# Patient Record
Sex: Male | Born: 1980 | Race: Black or African American | Hispanic: No | Marital: Married | Smoking: Former smoker
Health system: Southern US, Community
[De-identification: ages and names within clinical notes are randomized; demographics above are authoritative.]

## PROBLEM LIST (undated history)

## (undated) DIAGNOSIS — R001 Bradycardia, unspecified: Secondary | ICD-10-CM

## (undated) HISTORY — PX: KNEE ARTHROSCOPY: SUR90

## (undated) HISTORY — PX: OTHER SURGICAL HISTORY: SHX169

## (undated) HISTORY — DX: Bradycardia, unspecified: R00.1

## (undated) HISTORY — PX: KNEE SURGERY: SHX244

---

## 2004-08-22 ENCOUNTER — Emergency Department (HOSPITAL_COMMUNITY): Admission: EM | Admit: 2004-08-22 | Discharge: 2004-08-22 | Payer: Self-pay | Admitting: Emergency Medicine

## 2010-09-30 ENCOUNTER — Emergency Department (HOSPITAL_COMMUNITY)
Admission: EM | Admit: 2010-09-30 | Discharge: 2010-09-30 | Disposition: A | Discharge status: Home / Self Care | Payer: Self-pay | Attending: Emergency Medicine | Admitting: Emergency Medicine

## 2010-09-30 ENCOUNTER — Emergency Department (HOSPITAL_COMMUNITY): Payer: Self-pay

## 2010-09-30 DIAGNOSIS — M7989 Other specified soft tissue disorders: Secondary | ICD-10-CM | POA: Insufficient documentation

## 2010-09-30 DIAGNOSIS — M25569 Pain in unspecified knee: Secondary | ICD-10-CM | POA: Insufficient documentation

## 2012-07-26 ENCOUNTER — Emergency Department (HOSPITAL_COMMUNITY)
Admission: EM | Admit: 2012-07-26 | Discharge: 2012-07-26 | Disposition: A | Discharge status: Home / Self Care | Payer: Self-pay | Attending: Emergency Medicine | Admitting: Emergency Medicine

## 2012-07-26 ENCOUNTER — Encounter (HOSPITAL_COMMUNITY): Payer: Self-pay

## 2012-07-26 ENCOUNTER — Emergency Department (HOSPITAL_COMMUNITY): Payer: Self-pay

## 2012-07-26 DIAGNOSIS — Y9371 Activity, boxing: Secondary | ICD-10-CM | POA: Insufficient documentation

## 2012-07-26 DIAGNOSIS — M7710 Lateral epicondylitis, unspecified elbow: Secondary | ICD-10-CM | POA: Insufficient documentation

## 2012-07-26 DIAGNOSIS — Z87891 Personal history of nicotine dependence: Secondary | ICD-10-CM | POA: Insufficient documentation

## 2012-07-26 DIAGNOSIS — X500XXA Overexertion from strenuous movement or load, initial encounter: Secondary | ICD-10-CM | POA: Insufficient documentation

## 2012-07-26 DIAGNOSIS — M7712 Lateral epicondylitis, left elbow: Secondary | ICD-10-CM

## 2012-07-26 DIAGNOSIS — Y929 Unspecified place or not applicable: Secondary | ICD-10-CM | POA: Insufficient documentation

## 2012-07-26 MED ORDER — NAPROXEN 500 MG PO TABS: 500.0000 mg | ORAL_TABLET | Freq: Two times a day (BID) | ORAL | Status: DC

## 2012-07-26 NOTE — ED Notes (Signed)
Patient paraticipates in boxing and thinks he injured his left elbow approx a month ago. Patient states he has been icing and using Advil x 2 weeks with no relief. Patient attempted light boxing yesterday and had increased pain.  Patient able to wiggle fingers without difficulty.

## 2012-07-26 NOTE — ED Provider Notes (Signed)
History     CSN: [id number]  Arrival date & time 07/26/12  1013   First MD Initiated Contact with Patient 07/26/12 1024      Chief Complaint  Patient presents with  . Elbow Pain  . Elbow Injury    (Consider location/radiation/quality/duration/timing/severity/associated sxs/prior treatment) HPI Brad Coleman is a 32 y.o. male who presents to ED with complaint of a left elbow pain. 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 States felt better. Two days ago was able to box again. States felt well. States next morning (yesterday) woke up with stiffness and pain in left elbow again. States unable to straighter elbow all the way. Pain with movement. States thinks it is "dislocated, or maybe I have fluid on my joint that needs to be drained."   History reviewed. No pertinent past medical history.  Past Surgical History  Procedure Laterality Date  . Knee arthroscopy    . Arm fracture      Family History  Problem Relation Age of Onset  . Osteoarthritis Mother     History  Substance Use Topics  . Smoking status: Former Games developer  . Smokeless tobacco: Never Used  . Alcohol Use: No      Review of Systems  Constitutional: Negative for fever and chills.  Respiratory: Negative.   Cardiovascular: Negative.   Musculoskeletal: Positive for joint swelling and arthralgias.  Skin: Negative.   Neurological: Negative for weakness and numbness.    Allergies  Review of patient's allergies indicates no known allergies.  Home Medications   Current Outpatient Rx  Name  Route  Sig  Dispense  Refill  . ibuprofen (ADVIL,MOTRIN) 200 MG tablet   Oral   Take 800 mg by mouth every 6 (six) hours as needed for pain.           BP 141/63  Pulse 59  Temp(Src) 97.2 F (36.2 C) (Oral)  Resp 20  SpO2 99%  Physical Exam  Nursing note and vitals reviewed. Constitutional: He appears well-developed  and well-nourished. No distress.  Cardiovascular: Normal rate, regular rhythm and normal heart sounds.   Pulmonary/Chest: Effort normal and breath sounds normal. No respiratory distress. He has no wheezes. He has no rales.  Musculoskeletal:  Left elbow normal appearing. No swelling, bruising. Erythema. Tender over lateral epicondyle. Pain with flexion, pain with full extension. Pain with suppination and extension of the wrist. Normal distal radial pulses    ED Course  Procedures (including critical care time)  No results found for this or any previous visit. Dg Elbow Complete Left  07/26/2012  *RADIOLOGY REPORT*  Clinical Data: Left elbow pain.  LEFT ELBOW - COMPLETE 3+ VIEW  Comparison: None.  Findings: Rounded density near the medial epicondyle is most likely an unfused apophysis or old avulsion injury.  Minimal degenerative changes are noted.  No definite acute fracture but there is a small joint effusion.  IMPRESSION:  1.  Mild degenerative changes but no definite acute fracture. 2.  Probable unfused medial epicondyle apophysis or old avulsion injury. 3.  Small joint effusion.   Original Report Authenticated By: Rudie Meyer, M.D.       1. Lateral epicondylitis of elbow, left       MDM  Pt has no tenderness over medial epicondyle, but rather lateral. Suspect epicondylitis vs sprain. Sling given. Will follow up with hand specialist. NSAIds and rest at home. Neurovascularly  intact.         Lottie Mussel, PA 07/26/12 1535

## 2012-07-28 NOTE — ED Provider Notes (Signed)
Medical screening examination/treatment/procedure(s) were performed by non-physician practitioner and as supervising physician I was immediately available for consultation/collaboration.  Altin Sease J. Renzo Vincelette, MD 07/28/12 1323 

## 2016-03-15 ENCOUNTER — Ambulatory Visit: Payer: Self-pay | Admitting: Physician Assistant

## 2016-03-15 ENCOUNTER — Encounter: Payer: Self-pay | Admitting: Physician Assistant

## 2016-03-15 NOTE — Progress Notes (Signed)
Cardiology Office Note    Date:  03/16/2016   ID:  Tinnie Gens, DOB 10/25/1980, MRN [id number]  PCP:  No primary care provider on file.  Cardiologist:  New-abnormal ECG.  CC: abnormal ECG  History of Present Illness:  Brad Coleman is a 35 y.o. male with no past medical history who presents to clinic for evaluation of an abnormal ECG.  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 A ECG was done at his PCP in [facility] and read as abnormal with IRBBB, TWIs and old anterior infarct. P waves in AVR are upright and down in all other leads.   He presents today for cardiac evaluation. Subsequent ECGs and ours done today shows sinus bradycardia. No chest pain or SOB. No LE edema, orthopnea or PND. No dizziness or syncope. No blood in his stool or urine. He trains at the gym 5 times a week. He runs 5 miles 5x a week. No decreased exercise tolerance. Never passed out with exercise. No family history of sudden cardiac death.     Past Medical History:  Diagnosis Date  . Bradycardia     Past Surgical History:  Procedure Laterality Date  . KNEE SURGERY Right     Current Medications: No outpatient prescriptions prior to visit.   No facility-administered medications prior to visit.      Allergies:   Review of patient's allergies indicates no known allergies.   Social History   Social History  . Marital status: Married    Spouse name: N/A  . Number of children: 0  . Years of education: COLLEGE   Occupational History  . [employer]    Social History Main Topics  . Smoking status: Former Smoker    Quit date: 05/30/2010  . Smokeless tobacco: Never Used  . Alcohol use Yes  . Drug use: No  . Sexual activity: Not Asked   Other Topics Concern  . None   Social History Narrative  . None     Family History:  The patient's HTN in mom.   ROS:   Please see the history of present  illness.    ROS All other systems reviewed and are negative.   PHYSICAL EXAM:   VS:  BP 140/70   Pulse (!) 58   Ht 6\' 3"  (1.905 m)   Wt 285 lb (129.3 kg)   BMI 35.62 kg/m    GEN: Well nourished, well developed, in no acute distress  HEENT: normal  Neck: no JVD, carotid bruits, or masses Cardiac: *RRR; no murmurs, rubs, or gallops,no edema  Respiratory:  clear to auscultation bilaterally, normal work of breathing GI: soft, nontender, nondistended, + BS MS: no deformity or atrophy  Skin: warm and dry, no rash Neuro:  Alert and Oriented x 3, Strength and sensation are intact Psych: euthymic mood, full affect  Wt Readings from Last 3 Encounters:  03/16/16 285 lb (129.3 kg)      Studies/Labs Reviewed:   EKG:  EKG is ordered today.  The ekg ordered today demonstrates sinus brady with sinus arrythmia HR 58  Recent Labs: No results found for requested labs within last 8760 hours.   Lipid Panel No results found for: CHOL, TRIG, HDL, CHOLHDL, VLDL, LDLCALC, LDLDIRECT  Additional studies/ records that were reviewed today include:  none   ASSESSMENT & PLAN:   Abnormal ECG: first ECG was read  as abnormal but there was incorrect lead placement. Subsequent ECGs show sinus bradycardia which is normal in a conditioned athlete. No further work up is indicated. He is cleared to box from a cardiac standpoint. A letter has been written for cardiac clearance.     Medication Adjustments/Labs and Tests Ordered: Current medicines are reviewed at length with the patient today.  Concerns regarding medicines are outlined above.  Medication changes, Labs and Tests ordered today are listed in the Patient Instructions below. Patient Instructions  Medication Instructions:  Your physician recommends that you continue on your current medications as directed. Please refer to the Current Medication list given to you today.   Labwork: None ordered  Testing/Procedures: None  ordered  Follow-Up: Your physician recommends that you schedule a follow-up appointment in: AS NEEDED     Any Other Special Instructions Will Be Listed Below (If Applicable).  Good luck on your boxing match!      If you need a refill on your cardiac medications before your next appointment, please call your pharmacy.      Signed, Cline CrockKathryn Mariesa Grieder, PA-C  03/16/2016 2:08 PM    [facility] [address], [place], [place]  [address] Phone: [phone](336) [phone]; Fax: [phone]) [phone]

## 2016-03-15 NOTE — Progress Notes (Signed)
   Cardiology Office Note    Date:  03/15/2016   ID:  Brad Gensonald L Ney, DOB 08-03-1980, MRN [id number]  PCP:  No primary care provider on file.  Cardiologist: new  No chief complaint on file.   History of Present Illness:  Brad Coleman is a 35 y.o. male referred to us by [facility] because of a family history of sudden cardiac arrest Patient cancelled appointment today.   No past medical history on file.  No past surgical history on file.  Current Medications: No outpatient prescriptions prior to visit.   No facility-administered medications prior to visit.      Allergies:   Review of patient's allergies indicates not on file.   Social History   Social History  . Marital status: Unknown    Spouse name: N/A  . Number of children: N/A  . Years of education: N/A   Social History Main Topics  . Smoking status: Not on file  . Smokeless tobacco: Not on file  . Alcohol use Not on file  . Drug use: Unknown  . Sexual activity: Not on file   Other Topics Concern  . Not on file   Social History Narrative  . No narrative on file     Family History:  The patient's family history is not on file.   ROS:   Please see the history of present illness.    ROS All other systems reviewed and are negative.   PHYSICAL EXAM:   VS:  There were no vitals taken for this visit.  Physical Exam  GEN: Well nourished, well developed, in no acute distress  HEENT: normal  Neck: no JVD, carotid bruits, or masses Cardiac:RRR; no murmurs, rubs, or gallops  Respiratory:  clear to auscultation bilaterally, normal work of breathing GI: soft, nontender, nondistended, + BS Ext: without cyanosis, clubbing, or edema, Good distal pulses bilaterally MS: no deformity or atrophy  Skin: warm and dry, no rash Neuro:  Alert and Oriented x 3, Strength and sensation are intact Psych: euthymic mood, full affect  Wt Readings from Last 3  Encounters:  No data found for Wt      Studies/Labs Reviewed:   EKG:  EKG is ordered today.  The ekg ordered today demonstrates   Recent Labs: No results found for requested labs within last 8760 hours.   Lipid Panel No results found for: CHOL, TRIG, HDL, CHOLHDL, VLDL, LDLCALC, LDLDIRECT  Additional studies/ records that were reviewed today include:      ASSESSMENT:    No diagnosis found.   PLAN:  In order of problems listed above:      Medication Adjustments/Labs and Tests Ordered: Current medicines are reviewed at length with the patient today.  Concerns regarding medicines are outlined above.  Medication changes, Labs and Tests ordered today are listed in the Patient Instructions below. There are no Patient Instructions on file for this visit.   Signed, Jacolyn ReedyMichele Oluwafemi Villella, PA-C  03/15/2016 10:50 AM    [facility] [address], [place], [place]  [address] Phone: [phone]([phone]; Fax: [phone]([phone]

## 2016-03-16 ENCOUNTER — Ambulatory Visit (INDEPENDENT_AMBULATORY_CARE_PROVIDER_SITE_OTHER): Payer: Self-pay | Admitting: Physician Assistant

## 2016-03-16 ENCOUNTER — Encounter: Payer: Self-pay | Admitting: *Deleted

## 2016-03-16 ENCOUNTER — Encounter (INDEPENDENT_AMBULATORY_CARE_PROVIDER_SITE_OTHER): Payer: Self-pay

## 2016-03-16 ENCOUNTER — Encounter: Payer: Self-pay | Admitting: Physician Assistant

## 2016-03-16 VITALS — BP 140/70 | HR 58 | Ht 75.0 in | Wt 285.0 lb

## 2016-03-16 DIAGNOSIS — R9431 Abnormal electrocardiogram [ECG] [EKG]: Secondary | ICD-10-CM

## 2016-03-16 DIAGNOSIS — R001 Bradycardia, unspecified: Secondary | ICD-10-CM

## 2016-03-16 NOTE — Patient Instructions (Signed)
Medication Instructions:  Your physician recommends that you continue on your current medications as directed. Please refer to the Current Medication list given to you today.   Labwork: None ordered  Testing/Procedures: None ordered  Follow-Up: Your physician recommends that you schedule a follow-up appointment in: AS NEEDED     Any Other Special Instructions Will Be Listed Below (If Applicable).  Good luck on your boxing match!      If you need a refill on your cardiac medications before your next appointment, please call your pharmacy.

## 2016-03-17 ENCOUNTER — Telehealth: Payer: Self-pay | Admitting: Physician Assistant

## 2016-03-17 NOTE — Telephone Encounter (Signed)
Eileen StanfordJenna from [facility] calling requesting copy of office notes from yesterday.  They had referred him for abnormal EKG.  Faxed Aris GeorgiaKatie Thompson,PA's note to [phone].

## 2016-03-17 NOTE — Telephone Encounter (Signed)
Eileen StanfordJenna is calling get get office notes from yesterdays visit faxed to her. Fax:[phone]

## 2016-03-22 ENCOUNTER — Telehealth: Payer: Self-pay | Admitting: Physician Assistant

## 2016-03-22 NOTE — Telephone Encounter (Signed)
Will route to medical records to provide patient with his EKG.

## 2016-03-22 NOTE — Telephone Encounter (Signed)
Spoke with Patient-he will come by sign release and pick up copy of EKG.

## 2016-03-22 NOTE — Telephone Encounter (Signed)
Follow Up:    Pt saw Brad Coleman last week,he says he needs the actual reading of his EKG.Please call.

## 2016-05-14 ENCOUNTER — Encounter (HOSPITAL_COMMUNITY): Payer: Self-pay

## 2016-09-12 ENCOUNTER — Ambulatory Visit: Payer: BLUE CROSS/BLUE SHIELD | Attending: Family Medicine | Admitting: Physical Therapy

## 2016-09-12 ENCOUNTER — Encounter: Payer: Self-pay | Admitting: Physical Therapy

## 2016-09-12 DIAGNOSIS — M545 Low back pain, unspecified: Secondary | ICD-10-CM

## 2016-09-12 DIAGNOSIS — M62830 Muscle spasm of back: Secondary | ICD-10-CM | POA: Diagnosis present

## 2016-09-12 NOTE — Therapy (Signed)
[facility] [place] [facility] [address] [place], [place], [address] Phone: [phone]   Fax:  [phone]  Physical Therapy Evaluation  Patient Details  Name: Brad Coleman MRN: [id number] Date of Birth: [birth year] Referring Provider: Virl Son  Encounter Date: 09/12/2016      PT End of Session - 09/12/16 1348    Visit Number 1   Date for PT Re-Evaluation 11/12/16   PT Start Time 1316   PT Stop Time 1403   PT Time Calculation (min) 47 min   Activity Tolerance Patient tolerated treatment well   Behavior During Therapy [facility] for tasks assessed/performed      Past Medical History:  Diagnosis Date  . Bradycardia     Past Surgical History:  Procedure Laterality Date  . arm fracture    . KNEE ARTHROSCOPY    . KNEE SURGERY Right     There were no vitals filed for this visit.       Subjective Assessment - 09/12/16 1316    Subjective Patient reports that he has had long standing LBP.  Has a diagnosis of DDD.  Reports 5-8 years of some LBP.  Reports that he had some back pain that resolved but the past year he has had much more pain.  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   Patient Stated Goals have no pain   Currently in Pain? Yes   Pain Score 1    Pain Location Back   Pain Orientation Lower   Pain Descriptors / Indicators Tightness;Spasm;Sore   Pain Type Chronic pain   Pain Onset More than a month ago   Pain Frequency Intermittent   Aggravating Factors  first thing inthe AM is the worst, pain an 8/10   Pain Relieving Factors getting up and moving his pain will dissipate to 1-2/10   Effect of Pain on Daily Activities just sore.            [facility] PT Assessment - 09/12/16 0001      Assessment   Medical Diagnosis LBP   Referring Provider Tammy Boyd   Onset Date/Surgical Date 09/05/16   Prior Therapy no     Precautions   Precautions None     Balance Screen   Has the patient fallen in the past 6  months No   Has the patient had a decrease in activity level because of a fear of falling?  No   Is the patient reluctant to leave their home because of a fear of falling?  No     Home Environment   Additional Comments does some housework     Prior Function   Level of Independence Independent   Vocation Part time employment   Vocation Requirements lifting 5-100 # at [employer] 4-5 hours a day   Leisure Boxing, runs, starting to [employer] again     Posture/Postural Control   Posture Comments fwd head, rounded shoulders     ROM / Strength   AROM / PROM / Strength AROM;Strength     AROM   Overall AROM Comments Lumbar ROM was decreased 25% with some c/o tightness     Strength   Overall Strength Comments WNL's, mild increase of pain     Palpation   Palpation comment he is very tight in the lumbar  area, spasms are present , some tenderness in this area.  [facility] Adult PT Treatment/Exercise - 09/12/16 0001      Modalities   Modalities Electrical Stimulation;Moist Heat     Moist Heat Therapy   Number Minutes Moist Heat 15 Minutes   Moist Heat Location Lumbar Spine     Electrical Stimulation   Electrical Stimulation Location Lumbar spine   Electrical Stimulation Action IFC   Electrical Stimulation Parameters supine   Electrical Stimulation Goals Pain                PT Education - 09/12/16 1347    Education provided Yes   Education Details Wms Flexion   Person(s) Educated Patient   Methods Explanation;Demonstration;Handout   Comprehension Verbalized understanding          PT Short Term Goals - 09/12/16 1350      PT SHORT TERM GOAL #1   Title independent with HEP   Time 2   Period Weeks   Status New           PT Long Term Goals - 09/12/16 1350      PT LONG TERM GOAL #1   Title decrease pain 50% in the AM   Time 8   Period Weeks   Status New     PT LONG TERM GOAL #2   Title increase ROM to WNL's   Time 8   Period  Weeks   Status New     PT LONG TERM GOAL #3   Title understand proper posture and body mechanics   Time 8   Period Weeks   Status New     PT LONG TERM GOAL #4   Title return to weight lifting without difficulty   Time 8   Period Weeks   Status New               Plan - 09/12/16 1348    Clinical Impression Statement Patient reports that he had years of low back pain, had some resolution of pain but increased LBP over the past year.  X-rays show DDD.  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  He has good flexibility but some tihgtness, spasms are in the lumbar area, has most pain in the AM   Rehab Potential Good   PT Frequency 2x / week   PT Duration 6 weeks      Patient will benefit from skilled therapeutic intervention in order to improve the following deficits and impairments:  Decreased mobility, Improper body mechanics, Pain, Increased muscle spasms, Decreased range of motion  Visit Diagnosis: Acute midline low back pain without sciatica - Plan: PT plan of care cert/re-cert  Muscle spasm of back - Plan: PT plan of care cert/re-cert     Problem List There are no active problems to display for this patient.   Jearld Lesch., PT 09/12/2016, 1:52 PM  [facility] [address] [place], [place], [address] Phone: [phone]   Fax:  [phone]  Name: Brad Coleman MRN: [id number] Date of Birth: 1981/01/09

## 2016-09-14 ENCOUNTER — Encounter: Payer: Self-pay | Admitting: Physical Therapy

## 2016-09-14 ENCOUNTER — Ambulatory Visit: Payer: BLUE CROSS/BLUE SHIELD | Admitting: Physical Therapy

## 2016-09-14 DIAGNOSIS — M62830 Muscle spasm of back: Secondary | ICD-10-CM

## 2016-09-14 DIAGNOSIS — M545 Low back pain, unspecified: Secondary | ICD-10-CM

## 2016-09-14 NOTE — Therapy (Signed)
[facility] [place] [address] [place], [place], [address] Phone: [phone]   Fax:  [phone]  Physical Therapy Treatment  Patient Details  Name: Brad Coleman MRN: [id number] Date of Birth: 25-Jun-1980 Referring Provider: Virl Son  Encounter Date: 09/14/2016      PT End of Session - 09/14/16 1516    Visit Number 2   Date for PT Re-Evaluation 11/12/16   PT Start Time 1430   PT Stop Time 1527   PT Time Calculation (min) 57 min   Activity Tolerance Patient tolerated treatment well   Behavior During Therapy [facility] for tasks assessed/performed      Past Medical History:  Diagnosis Date  . Bradycardia     Past Surgical History:  Procedure Laterality Date  . arm fracture    . KNEE ARTHROSCOPY    . KNEE SURGERY Right     There were no vitals filed for this visit.      Subjective Assessment - 09/14/16 1434    Subjective Pt reports no changes evaluation. Pt reports that the heat and e-Stim really helped   Currently in Pain? No/denies   Pain Score 0-No pain                         [facility] Adult PT Treatment/Exercise - 09/14/16 0001      Exercises   Exercises Lumbar     Lumbar Exercises: Standing   Other Standing Lumbar Exercises Pall of press 35 x10, 45x10    Other Standing Lumbar Exercises straight arm pull downs 35lb 2x15      Lumbar Exercises: Seated   Other Seated Lumbar Exercises Rows and lats 30flb 2x10   Other Seated Lumbar Exercises seated OHP LE elevated 10lb 2x10     Lumbar Exercises: Supine   Other Supine Lumbar Exercises russian twist seated on mat table LE elevated 2x10 each    Other Supine Lumbar Exercises #pt row 10 lb 2x15      Modalities   Modalities Electrical Stimulation;Moist Heat     Moist Heat Therapy   Number Minutes Moist Heat 15 Minutes   Moist Heat Location Lumbar Spine     Electrical Stimulation   Electrical Stimulation Location Lumbar spine   Electrical Stimulation Action IFC   Electrical Stimulation Parameters supine   Electrical Stimulation Goals Pain                  PT Short Term Goals - 09/12/16 1350      PT SHORT TERM GOAL #1   Title independent with HEP   Time 2   Period Weeks   Status New           PT Long Term Goals - 09/12/16 1350      PT LONG TERM GOAL #1   Title decrease pain 50% in the AM   Time 8   Period Weeks   Status New     PT LONG TERM GOAL #2   Title increase ROM to WNL's   Time 8   Period Weeks   Status New     PT LONG TERM GOAL #3   Title understand proper posture and body mechanics   Time 8   Period Weeks   Status New     PT LONG TERM GOAL #4   Title return to weight lifting without difficulty   Time 8   Period Weeks   Status New  Plan - 09/14/16 1516    Clinical Impression Statement Positive response  from heat and E-Stim after last treatment. Pt tolerated an initial progression to exercises well. Pt reports no increase in pain with today's exercises.    PT Frequency 2x / week   PT Duration 6 weeks   PT Next Visit Plan Progress with core strengthening exercises       Patient will benefit from skilled therapeutic intervention in order to improve the following deficits and impairments:  Decreased mobility, Improper body mechanics, Pain, Increased muscle spasms, Decreased range of motion  Visit Diagnosis: Acute midline low back pain without sciatica  Muscle spasm of back     Problem List There are no active problems to display for this patient.   Grayce Sessions, PTA 09/14/2016, 3:20 PM  [facility] [place] [facility] [address] [place], [place], [address] Phone: [phone]   Fax:  [phone]  Name: Brad Coleman MRN: [id number] Date of Birth: July 18, 1980

## 2016-09-20 ENCOUNTER — Ambulatory Visit: Payer: BLUE CROSS/BLUE SHIELD | Admitting: Physical Therapy

## 2016-09-20 ENCOUNTER — Encounter: Payer: Self-pay | Admitting: Physical Therapy

## 2016-09-20 DIAGNOSIS — M545 Low back pain, unspecified: Secondary | ICD-10-CM

## 2016-09-20 DIAGNOSIS — M62830 Muscle spasm of back: Secondary | ICD-10-CM

## 2016-09-20 NOTE — Therapy (Addendum)
[place] [address], [place], [address] Phone: [phone]   Fax:  [phone]  Physical Therapy Treatment  Patient Details  Name: Brad Coleman MRN: [id number] Date of Birth: 12-02-1980 Referring Provider: Precious Haws  Encounter Date: 09/20/2016      PT End of Session - 09/20/16 1058    Visit Number 3   Date for PT Re-Evaluation 11/12/16   PT Start Time 6962   PT Stop Time 1111   PT Time Calculation (min) 56 min   Activity Tolerance Patient tolerated treatment well   Behavior During Therapy [facility] for tasks assessed/performed      Past Medical History:  Diagnosis Date  . Bradycardia     Past Surgical History:  Procedure Laterality Date  . arm fracture    . KNEE ARTHROSCOPY    . KNEE SURGERY Right     There were no vitals filed for this visit.      Subjective Assessment - 09/20/16 1019    Subjective "Pt reports that he is feeling pretty good"   Currently in Pain? Yes   Pain Score 2    Pain Location Back                         [facility] Adult PT Treatment/Exercise - 09/20/16 0001      Lumbar Exercises: Aerobic   Elliptical NuStep L4 x 6 min     Lumbar Exercises: Machines for Strengthening   Leg Press 50lb 2x15       Lumbar Exercises: Standing   Other Standing Lumbar Exercises Pall of press  45lb 2x10      Lumbar Exercises: Seated   Other Seated Lumbar Exercises Rows and lats 45lb 2x10   Other Seated Lumbar Exercises seated OHP LE elevated 10lb 2x10     Lumbar Exercises: Supine   Other Supine Lumbar Exercises 3pt row 10 lb 2x15      Lumbar Exercises: Prone   Other Prone Lumbar Exercises Planks 3 x 40sec     Modalities   Modalities Cryotherapy;Electrical Stimulation     Cryotherapy   Number Minutes Cryotherapy 15 Minutes   Cryotherapy Location Lumbar Spine   Type of Cryotherapy Ice pack     Electrical Stimulation   Electrical Stimulation Location  Lumbar spine   Electrical Stimulation Action IFC   Electrical Stimulation Parameters supine   Electrical Stimulation Goals Pain                  PT Short Term Goals - 09/20/16 1058      PT SHORT TERM GOAL #1   Title independent with HEP   Status Achieved           PT Long Term Goals - 09/20/16 1058      PT LONG TERM GOAL #1   Title decrease pain 50% in the AM   Status Partially Met     PT LONG TERM GOAL #2   Title increase ROM to WNL's   Status On-going     PT LONG TERM GOAL #3   Title understand proper posture and body mechanics   Status On-going     PT LONG TERM GOAL #4   Title return to weight lifting without difficulty   Status On-going               Plan - 09/20/16 1058    Clinical Impression Statement STG met, tolerated treatment well,  no reports of increase pain. Pt does reports that the prone planks are tough. Some difficulty controlling posture with 3 point rows.   Rehab Potential Good   PT Frequency 2x / week   PT Duration 6 weeks   PT Next Visit Plan Progress with core strengthening exercises       Patient will benefit from skilled therapeutic intervention in order to improve the following deficits and impairments:  Decreased mobility, Improper body mechanics, Pain, Increased muscle spasms, Decreased range of motion  Visit Diagnosis: Acute midline low back pain without sciatica  Muscle spasm of back     Problem List There are no active problems to display for this patient.  PHYSICAL THERAPY DISCHARGE SUMMARY  Visits from Start of Care: 3  Plan: Patient agrees to discharge.  Patient goals were not met. Patient is being discharged due to not returning since the last visit.  ?????      Scot Jun, PTA 09/20/2016, 11:01 AM  [address] [place], [place], [address] Phone: [phone]   Fax:  [phone]  Name: Brad Coleman MRN:  [id number] Date of Birth: 1980/09/02

## 2016-09-22 ENCOUNTER — Ambulatory Visit: Payer: BLUE CROSS/BLUE SHIELD | Admitting: Physical Therapy

## 2016-09-27 ENCOUNTER — Ambulatory Visit: Payer: BLUE CROSS/BLUE SHIELD | Admitting: Physical Therapy

## 2016-11-15 ENCOUNTER — Ambulatory Visit: Payer: BLUE CROSS/BLUE SHIELD | Attending: Family Medicine | Admitting: Physical Therapy

## 2016-11-17 ENCOUNTER — Ambulatory Visit: Payer: BLUE CROSS/BLUE SHIELD | Admitting: Physical Therapy

## 2016-11-22 ENCOUNTER — Ambulatory Visit: Payer: BLUE CROSS/BLUE SHIELD | Admitting: Physical Therapy

## 2016-11-24 ENCOUNTER — Ambulatory Visit: Payer: BLUE CROSS/BLUE SHIELD | Admitting: Physical Therapy

## 2018-04-30 ENCOUNTER — Emergency Department (HOSPITAL_BASED_OUTPATIENT_CLINIC_OR_DEPARTMENT_OTHER)
Admission: EM | Admit: 2018-04-30 | Discharge: 2018-04-30 | Disposition: A | Discharge status: Home / Self Care | Payer: BLUE CROSS/BLUE SHIELD | Attending: Emergency Medicine | Admitting: Emergency Medicine

## 2018-04-30 ENCOUNTER — Encounter (HOSPITAL_BASED_OUTPATIENT_CLINIC_OR_DEPARTMENT_OTHER): Payer: Self-pay | Admitting: *Deleted

## 2018-04-30 ENCOUNTER — Emergency Department (HOSPITAL_BASED_OUTPATIENT_CLINIC_OR_DEPARTMENT_OTHER): Payer: BLUE CROSS/BLUE SHIELD

## 2018-04-30 ENCOUNTER — Other Ambulatory Visit: Payer: Self-pay

## 2018-04-30 DIAGNOSIS — R4189 Other symptoms and signs involving cognitive functions and awareness: Secondary | ICD-10-CM

## 2018-04-30 DIAGNOSIS — R4182 Altered mental status, unspecified: Secondary | ICD-10-CM | POA: Insufficient documentation

## 2018-04-30 DIAGNOSIS — Z79899 Other long term (current) drug therapy: Secondary | ICD-10-CM | POA: Insufficient documentation

## 2018-04-30 DIAGNOSIS — Z87891 Personal history of nicotine dependence: Secondary | ICD-10-CM | POA: Insufficient documentation

## 2018-04-30 LAB — CBC WITH DIFFERENTIAL/PLATELET
Abs Immature Granulocytes: 0.01 10*3/uL (ref 0.00–0.07)
Basophils Absolute: 0 10*3/uL (ref 0.0–0.1)
Basophils Relative: 1 %
Eosinophils Absolute: 0.2 10*3/uL (ref 0.0–0.5)
Eosinophils Relative: 3 %
HEMATOCRIT: 47.3 % (ref 39.0–52.0)
HEMOGLOBIN: 15.4 g/dL (ref 13.0–17.0)
Immature Granulocytes: 0 %
LYMPHS ABS: 1.5 10*3/uL (ref 0.7–4.0)
LYMPHS PCT: 25 %
MCH: 28 pg (ref 26.0–34.0)
MCHC: 32.6 g/dL (ref 30.0–36.0)
MCV: 86 fL (ref 80.0–100.0)
MONO ABS: 0.5 10*3/uL (ref 0.1–1.0)
Monocytes Relative: 8 %
Neutro Abs: 3.7 10*3/uL (ref 1.7–7.7)
Neutrophils Relative %: 63 %
Platelets: 205 10*3/uL (ref 150–400)
RBC: 5.5 MIL/uL (ref 4.22–5.81)
RDW: 11.9 % (ref 11.5–15.5)
WBC: 5.9 10*3/uL (ref 4.0–10.5)
nRBC: 0 % (ref 0.0–0.2)

## 2018-04-30 LAB — BASIC METABOLIC PANEL
Anion gap: 9 (ref 5–15)
BUN: 15 mg/dL (ref 6–20)
CHLORIDE: 105 mmol/L (ref 98–111)
CO2: 25 mmol/L (ref 22–32)
CREATININE: 1.22 mg/dL (ref 0.61–1.24)
Calcium: 9.1 mg/dL (ref 8.9–10.3)
GFR calc non Af Amer: >60 mL/min (ref >60)
Glucose, Bld: 83 mg/dL (ref 70–99)
POTASSIUM: 3.7 mmol/L (ref 3.5–5.1)
Sodium: 139 mmol/L (ref 135–145)

## 2018-04-30 LAB — MAGNESIUM: Magnesium: 2.2 mg/dL (ref 1.7–2.4)

## 2018-04-30 NOTE — ED Triage Notes (Signed)
Headache on and off x yesterday. 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

## 2018-04-30 NOTE — ED Provider Notes (Signed)
[facility] EMERGENCY DEPARTMENT Provider Note   CSN: [id number] Arrival date & time: 04/30/18  1813     History   Chief Complaint Chief Complaint  Patient presents with  . Headache    HPI ARCH METHOT is a 37 y.o. male.  The history is provided by the patient. No language interpreter was used.  Headache     RODOLPHE EDMONSTON is a 37 y.o. male who presents to the Emergency Department complaining of headache, CT scan. Since to the emergency department complaining of anemia CT scan. 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 He states that he has been having episodes of feeling often dazing out at times. Last episode was several days ago. When his name was called he came to. He is unsure if he blacked out or if he was just very tired at the time. He denies any vision changes, nausea, vomiting, diarrhea. He has no medical problems. He takes ibuprofen over-the-counter. No additional medications. He does not smoke, drink alcohol or use drugs. He has occasional headaches, possibly once a week. Past Medical History:  Diagnosis Date  . Bradycardia     There are no active problems to display for this patient.   Past Surgical History:  Procedure Laterality Date  . arm fracture    . KNEE ARTHROSCOPY    . KNEE SURGERY Right         Home Medications    Prior to Admission medications   Medication Sig Start Date End Date Taking? Authorizing Provider  ibuprofen (ADVIL,MOTRIN) 200 MG tablet Take 800 mg by mouth every 6 (six) hours as needed for pain.   Yes [provider]  naproxen (NAPROSYN) 500 MG tablet Take 1 tablet (500 mg total) by mouth 2 (two) times daily. 07/26/12   Jaynie Crumble, PA-C    Family History Family History  Problem Relation Age of Onset  . Heart attack Neg Hx   . Osteoarthritis Mother     Social History Social  History   Tobacco Use  . Smoking status: Former Smoker    Last attempt to quit: 05/30/2010    Years since quitting: 7.9  . Smokeless tobacco: Never Used  Substance Use Topics  . Alcohol use: Yes  . Drug use: No     Allergies   Patient has no known allergies.   Review of Systems Review of Systems  Neurological: Positive for headaches.  All other systems reviewed and are negative.    Physical Exam Updated Vital Signs BP 139/77   Pulse 70   Temp 98.6 F (37 C) (Oral)   Resp 18   Ht 6\' 3"  (1.905 m)   Wt 127 kg   SpO2 99%   BMI 35.00 kg/m   Physical Exam  Constitutional: He is oriented to person, place, and time. He appears well-developed and well-nourished.  HENT:  Head: Normocephalic and atraumatic.  Right Ear: External ear normal.  Left Ear: External ear normal.  Mouth/Throat: Oropharynx is clear and moist.  Eyes: Pupils are equal, round, and reactive to light. EOM are normal.  Cardiovascular: Normal rate and regular rhythm.  No murmur heard. Pulmonary/Chest: Effort normal and breath sounds normal. No respiratory distress.  Abdominal: Soft. There is no tenderness. There is no rebound and no guarding.  Musculoskeletal: He exhibits no edema or tenderness.  Neurological: He is alert and oriented  to person, place, and time.  Cranial nerves are grossly intact. Visual fields grossly intact. Five out of five strength in all four extremities with sensation to light touch intact in all four extremities  Skin: Skin is warm and dry.  Psychiatric: He has a normal mood and affect. His behavior is normal.  Nursing note and vitals reviewed.    ED Treatments / Results  Labs (all labs ordered are listed, but only abnormal results are displayed) Labs Reviewed  CBC WITH DIFFERENTIAL/PLATELET  BASIC METABOLIC PANEL  MAGNESIUM    EKG EKG Interpretation  Date/Time:  Monday April 30 2018 19:51:31 EST Ventricular Rate:  67 PR Interval:  184 QRS Duration: 108 QT  Interval:  436 QTC Calculation: 460 R Axis:   80 Text Interpretation:  Normal sinus rhythm with sinus arrhythmia Incomplete right bundle branch block Borderline ECG Confirmed by Tilden Fossaees, Bonifacio Pruden [phone]([id number]) on 04/30/2018 7:59:39 PM   Radiology Ct Head Wo Contrast  Result Date: 04/30/2018 CLINICAL DATA:  Boxer.  Headache. EXAM: CT HEAD WITHOUT CONTRAST TECHNIQUE: Contiguous axial images were obtained from the base of the skull through the vertex without intravenous contrast. COMPARISON:  None. FINDINGS: Brain: No acute intracranial abnormality. Specifically, no hemorrhage, hydrocephalus, mass lesion, acute infarction, or significant intracranial injury. Vascular: No hyperdense vessel or unexpected calcification. Skull: No acute calvarial abnormality. Sinuses/Orbits: Visualized paranasal sinuses and mastoids clear. Orbital soft tissues unremarkable. Other: None IMPRESSION: No acute intracranial abnormality. Electronically Signed   By: Charlett NoseKevin  Dover M.D.   On: 04/30/2018 20:48    Procedures Procedures (including critical care time)  Medications Ordered in ED Medications - No data to display   Initial Impression / Assessment and Plan / ED Course  I have reviewed the triage vital signs and the nursing notes.  Pertinent labs & imaging results that were available during my care of the patient were reviewed by me and considered in my medical decision making (see chart for details).    patient with request for CT scan for clearance for boxing. He has had episodes of dazing and zoning out as well as occasional headaches. He has a normal neurologic examination and is non-toxic appearing. Scan is negative for abnormality. No significant electrolyte abnormality. Discussed with patient importance of outpatient follow-up due to unclear source of his episodes, possible concussive syndrome versus absence seizures. Return precautions discussed. Final Clinical Impressions(s) / ED Diagnoses   Final diagnoses:    None    ED Discharge Orders    None       Tilden Fossaees, Caleb Prigmore, MD 04/30/18 2055

## 2018-04-30 NOTE — Discharge Instructions (Addendum)
The cause of your symptoms was not identified in the emergency department today. It is very important that you follow up with neurology as well as your family doctor. Get rechecked immediately if you develop any new or concerning symptoms.

## 2018-08-11 ENCOUNTER — Other Ambulatory Visit: Payer: Self-pay

## 2018-08-11 ENCOUNTER — Encounter (HOSPITAL_COMMUNITY): Payer: Self-pay

## 2018-08-11 ENCOUNTER — Emergency Department (HOSPITAL_COMMUNITY): Payer: BLUE CROSS/BLUE SHIELD

## 2018-08-11 ENCOUNTER — Emergency Department (HOSPITAL_COMMUNITY)
Admission: EM | Admit: 2018-08-11 | Discharge: 2018-08-11 | Disposition: A | Discharge status: Home / Self Care | Payer: BLUE CROSS/BLUE SHIELD | Attending: Emergency Medicine | Admitting: Emergency Medicine

## 2018-08-11 DIAGNOSIS — J101 Influenza due to other identified influenza virus with other respiratory manifestations: Secondary | ICD-10-CM | POA: Insufficient documentation

## 2018-08-11 DIAGNOSIS — Z79899 Other long term (current) drug therapy: Secondary | ICD-10-CM | POA: Insufficient documentation

## 2018-08-11 LAB — RESPIRATORY PANEL BY PCR
ADENOVIRUS-RVPPCR: NOT DETECTED
Bordetella pertussis: NOT DETECTED
CHLAMYDOPHILA PNEUMONIAE-RVPPCR: NOT DETECTED
CORONAVIRUS 229E-RVPPCR: NOT DETECTED
CORONAVIRUS HKU1-RVPPCR: NOT DETECTED
Coronavirus NL63: NOT DETECTED
Coronavirus OC43: NOT DETECTED
Influenza A: NOT DETECTED
Influenza B: DETECTED — AB
MYCOPLASMA PNEUMONIAE-RVPPCR: NOT DETECTED
Metapneumovirus: NOT DETECTED
PARAINFLUENZA VIRUS 2-RVPPCR: NOT DETECTED
PARAINFLUENZA VIRUS 3-RVPPCR: NOT DETECTED
PARAINFLUENZA VIRUS 4-RVPPCR: NOT DETECTED
Parainfluenza Virus 1: NOT DETECTED
RHINOVIRUS / ENTEROVIRUS - RVPPCR: NOT DETECTED
Respiratory Syncytial Virus: NOT DETECTED

## 2018-08-11 MED ORDER — OSELTAMIVIR PHOSPHATE 75 MG PO CAPS
75.0000 mg | ORAL_CAPSULE | Freq: Once | ORAL | Status: AC
  Administered 2018-08-11: 75 mg via ORAL
  Filled 2018-08-11: qty 1

## 2018-08-11 MED ORDER — BENZONATATE 100 MG PO CAPS: 100.0000 mg | ORAL_CAPSULE | Freq: Three times a day (TID) | ORAL | 0 refills | Status: AC

## 2018-08-11 MED ORDER — PREDNISONE 20 MG PO TABS: ORAL_TABLET | ORAL | 0 refills | Status: AC

## 2018-08-11 MED ORDER — PREDNISONE 20 MG PO TABS
60.0000 mg | ORAL_TABLET | Freq: Once | ORAL | Status: AC
  Administered 2018-08-11: 60 mg via ORAL
  Filled 2018-08-11: qty 3

## 2018-08-11 MED ORDER — OSELTAMIVIR PHOSPHATE 75 MG PO CAPS: 75.0000 mg | ORAL_CAPSULE | Freq: Two times a day (BID) | ORAL | 0 refills | Status: AC

## 2018-08-11 MED ORDER — ALBUTEROL SULFATE HFA 108 (90 BASE) MCG/ACT IN AERS
2.0000 | INHALATION_SPRAY | RESPIRATORY_TRACT | Status: DC | PRN
  Administered 2018-08-11: 2 via RESPIRATORY_TRACT
  Filled 2018-08-11: qty 6.7

## 2018-08-11 MED ORDER — BENZONATATE 100 MG PO CAPS
100.0000 mg | ORAL_CAPSULE | Freq: Once | ORAL | Status: AC
  Administered 2018-08-11: 100 mg via ORAL
  Filled 2018-08-11: qty 1

## 2018-08-11 NOTE — ED Provider Notes (Signed)
[facility]-EMERGENCY DEPT Provider Note   CSN: [id number] Arrival date & time: 08/11/18  4008    History   Chief Complaint Chief Complaint  Patient presents with  . Cough  . Fever    HPI Brad Coleman is a 38 y.o. male.     The history is provided by the patient. No language interpreter was used.  Cough  Associated symptoms: fever   Fever  Associated symptoms: cough      38 year old male presenting for evaluation cough.  Patient report for the past 2-3 days he has had fever as high as 104, mild congestion, and cough mildly productive with white sputum.  He denies any associated headache, runny nose, sneezing, sore throat, ear pain, shortness of breath, abdominal pain, nausea vomiting diarrhea or rash.  He took some over-the-counter medication which significantly help with his symptom.  He went to urgent care this morning, states he had a negative flu test but was recommended to come to the ER for further valuation.  He did mention traveling to [place] for a boxing match 20 days ago but denies any sick contact.  He is not a smoker.  He is otherwise healthy.  Patient did took Tylenol prior to arrival.  Past Medical History:  Diagnosis Date  . Bradycardia     There are no active problems to display for this patient.   Past Surgical History:  Procedure Laterality Date  . arm fracture    . KNEE ARTHROSCOPY    . KNEE SURGERY Right         Home Medications    Prior to Admission medications   Medication Sig Start Date End Date Taking? Authorizing Provider  ibuprofen (ADVIL,MOTRIN) 200 MG tablet Take 800 mg by mouth every 6 (six) hours as needed for pain.    [provider]  naproxen (NAPROSYN) 500 MG tablet Take 1 tablet (500 mg total) by mouth 2 (two) times daily. 07/26/12   Jaynie Crumble, PA-C    Family History Family History  Problem Relation Age of Onset  . Heart attack Neg Hx   . Osteoarthritis Mother     Social  History Social History   Tobacco Use  . Smoking status: Former Smoker    Last attempt to quit: 05/30/2010    Years since quitting: 8.2  . Smokeless tobacco: Never Used  Substance Use Topics  . Alcohol use: Yes  . Drug use: No     Allergies   Patient has no known allergies.   Review of Systems Review of Systems  Constitutional: Positive for fever.  Respiratory: Positive for cough.   All other systems reviewed and are negative.    Physical Exam Updated Vital Signs BP (!) 159/101   Pulse 79   Temp 99.2 F (37.3 C) (Oral)   Resp 18   Ht 6\' 3"  (1.905 m)   Wt 127 kg   SpO2 97%   BMI 35.00 kg/m   Physical Exam Vitals signs and nursing note reviewed.  Constitutional:      General: He is not in acute distress.    Appearance: He is well-developed.  HENT:     Head: Atraumatic.     Right Ear: Tympanic membrane normal.     Left Ear: Tympanic membrane normal.     Nose: Nose normal.     Mouth/Throat:     Mouth: Mucous membranes are moist.  Eyes:     Conjunctiva/sclera: Conjunctivae normal.  Neck:     Musculoskeletal:  Neck supple. No neck rigidity.  Cardiovascular:     Rate and Rhythm: Normal rate and regular rhythm.     Pulses: Normal pulses.     Heart sounds: Normal heart sounds.  Pulmonary:     Effort: Pulmonary effort is normal.     Breath sounds: Normal breath sounds. No wheezing, rhonchi or rales.  Abdominal:     Palpations: Abdomen is soft.     Tenderness: There is no abdominal tenderness.  Lymphadenopathy:     Cervical: No cervical adenopathy.  Skin:    Findings: No rash.  Neurological:     Mental Status: He is alert and oriented to person, place, and time.  Psychiatric:        Mood and Affect: Mood normal.      ED Treatments / Results  Labs (all labs ordered are listed, but only abnormal results are displayed) Labs Reviewed  RESPIRATORY PANEL BY PCR - Abnormal; Notable for the following components:      Result Value   Influenza B DETECTED (*)     All other components within normal limits  NOVEL CORONAVIRUS, NAA (HOSPITAL ORDER, SEND-OUT TO REF LAB)    EKG None  Radiology Dg Chest 2 View  Result Date: 08/11/2018 CLINICAL DATA:  Cough and fever over the last several days. EXAM: CHEST - 2 VIEW COMPARISON:  None. FINDINGS: Heart size is normal. Mediastinal shadows are normal. There is central bronchial thickening, but no infiltrate, effusion or collapse. No significant bone finding. IMPRESSION: Bronchitis pattern.  No consolidation or collapse. Electronically Signed   By: Paulina Fusi M.D.   On: 08/11/2018 11:03    Procedures Procedures (including critical care time)  Medications Ordered in ED Medications  albuterol (PROVENTIL HFA;VENTOLIN HFA) 108 (90 Base) MCG/ACT inhaler 2 puff (2 puffs Inhalation Given 08/11/18 1144)  oseltamivir (TAMIFLU) capsule 75 mg (has no administration in time range)  predniSONE (DELTASONE) tablet 60 mg (60 mg Oral Given 08/11/18 1143)  benzonatate (TESSALON) capsule 100 mg (100 mg Oral Given 08/11/18 1143)     Initial Impression / Assessment and Plan / ED Course  I have reviewed the triage vital signs and the nursing notes.  Pertinent labs & imaging results that were available during my care of the patient were reviewed by me and considered in my medical decision making (see chart for details).        BP (!) 130/91 (BP Location: Right Arm)   Pulse 81   Temp 100.3 F (37.9 C) (Oral)   Resp 18   Ht 6\' 3"  (1.905 m)   Wt 127 kg   SpO2 99%   BMI 35.00 kg/m    Final Clinical Impressions(s) / ED Diagnoses   Final diagnoses:  Influenza B    ED Discharge Orders         Ordered    predniSONE (DELTASONE) 20 MG tablet     08/11/18 1449    benzonatate (TESSALON) 100 MG capsule  Every 8 hours     08/11/18 1449    oseltamivir (TAMIFLU) 75 MG capsule  Every 12 hours     08/11/18 1527         10:44 AM Patient here with fever and cough.  Symptoms ongoing for the past 2 to 3 days.  He  went to urgent care and was tested negative for the flu.  He was recommended to come to the ED due to previous international travel to [place] approximately 20 days ago.  At this time patient  is well-appearing, my suspicion for COVID-19 exposure is very low.  I do not think testing for it is appropriate after discussing with Dr. Freida BusmanAllen.  Chest x-ray ordered to rule out pneumonia.  11:10 AM Further discussion with our infectious disease specialist Dr. Orvan Falconerampbell and with DR. Freida BusmanAllen, we felt due to unexplained cold sxs with negative flu test at Urgent Care, it would be prudent to test pt for COVID-19.  Pt also made aware that it will take 4 days (being that this is the weekend and [facility] is not actively testing) instead of 48 hrs turn around time.  Pt will need to self quarantine, until notify further.   1:56 PM A viral respiratory panel have been sent, will await result.  If negative, pt to self quarantine until result of COVID-19 is available.  Pt is aware and agrees with plan.    3:09 PM Pt sign out to oncoming provider.  Currently awaits Viral Respiratory Panel.  If tested positive, then we should discontinue COVID-19 test.  Pt will be treated for bronchitis with tessalon, prednisone and albuterol.  If Respiratory panel negative, then will send COVID-19 and fill out appropriate paperwork and contact IP.  Pt will also need to self quarantine until COVID-19 resulted.   3:26 PM Viral REspiratory Panel return and pt testes positive for Influenza B.  Therefore, will discontinue COVID-19 test and will treat pt with Tamiflu.  Pt is reassured.  Standard precaution discussed.  Return precaution given.    Fayrene Helperran, Kayton Ripp, PA-C 08/11/18 1528    Lorre NickAllen, Anthony, MD 08/12/18 [phone]

## 2018-08-11 NOTE — ED Notes (Signed)
Pt placed on droplet and contact precautions

## 2018-08-11 NOTE — Discharge Instructions (Addendum)
You have been tested for your symptoms.  Initial concern for potential corona virus, COVD-19.  However you are positive for Influenza B.  Please take medications as prescribed.  REturn if you have any concerns.

## 2018-08-11 NOTE — ED Triage Notes (Signed)
Talked to Angie from IP and she reports to place patient on droplet and contact precautions.

## 2018-08-11 NOTE — ED Triage Notes (Addendum)
Pt presents with c/o cough that has been present for a couple of days. Pt reports the cough has mostly subsided today but he has a slight fever. Pt was in [place] from Feb 20-Feb 23rd.

## 2018-10-19 ENCOUNTER — Other Ambulatory Visit: Payer: Self-pay | Admitting: *Deleted

## 2018-10-19 ENCOUNTER — Other Ambulatory Visit: Payer: Self-pay | Admitting: Orthopaedic Surgery

## 2018-10-19 DIAGNOSIS — Z13850 Encounter for screening for traumatic brain injury: Secondary | ICD-10-CM

## 2018-10-23 ENCOUNTER — Other Ambulatory Visit: Payer: Self-pay

## 2018-10-23 ENCOUNTER — Ambulatory Visit
Admission: RE | Admit: 2018-10-23 | Discharge: 2018-10-23 | Disposition: A | Discharge status: Home / Self Care | Payer: Self-pay | Source: Ambulatory Visit | Attending: Orthopaedic Surgery | Admitting: Orthopaedic Surgery

## 2018-10-23 ENCOUNTER — Other Ambulatory Visit: Payer: Self-pay | Admitting: Orthopaedic Surgery

## 2018-10-23 ENCOUNTER — Ambulatory Visit
Admission: RE | Admit: 2018-10-23 | Discharge: 2018-10-23 | Disposition: A | Discharge status: Home / Self Care | Payer: No Typology Code available for payment source | Source: Ambulatory Visit | Attending: Orthopaedic Surgery | Admitting: Orthopaedic Surgery

## 2018-10-23 DIAGNOSIS — Z13850 Encounter for screening for traumatic brain injury: Secondary | ICD-10-CM

## 2020-01-07 ENCOUNTER — Other Ambulatory Visit: Payer: Self-pay | Admitting: Family Medicine

## 2020-01-07 DIAGNOSIS — M25561 Pain in right knee: Secondary | ICD-10-CM

## 2020-01-30 ENCOUNTER — Other Ambulatory Visit: Payer: Self-pay

## 2020-01-30 ENCOUNTER — Ambulatory Visit
Admission: RE | Admit: 2020-01-30 | Discharge: 2020-01-30 | Disposition: A | Discharge status: Home / Self Care | Payer: No Typology Code available for payment source | Source: Ambulatory Visit | Attending: Family Medicine | Admitting: Family Medicine

## 2020-01-30 DIAGNOSIS — M25561 Pain in right knee: Secondary | ICD-10-CM

## 2022-02-17 ENCOUNTER — Ambulatory Visit (HOSPITAL_COMMUNITY)
Admission: RE | Admit: 2022-02-17 | Discharge: 2022-02-17 | Disposition: A | Discharge status: Home / Self Care | Payer: Self-pay | Source: Ambulatory Visit | Attending: *Deleted | Admitting: *Deleted

## 2022-02-17 DIAGNOSIS — R569 Unspecified convulsions: Secondary | ICD-10-CM

## 2022-02-17 NOTE — Progress Notes (Signed)
EEG complete - results pending 

## 2022-02-17 NOTE — Procedures (Signed)
Patient Name: WACO FOERSTER  MRN: [id number]  Epilepsy Attending: Lora Havens  Referring Physician/Provider: Dr Bernerd Limbo Date: 02/17/2022 Duration: 24.29 mins  Patient history: 41 year old male getting EEG to evaluate for seizure.  Level of alertness: Awake, asleep  AEDs during EEG study: None  Technical aspects: This EEG study was done with scalp electrodes positioned according to the 10-20 International system of electrode placement. Electrical activity was reviewed with band pass filter of 1-70Hz , sensitivity of 7 uV/mm, display speed of 89mm/sec with a 60Hz  notched filter applied as appropriate. EEG data were recorded continuously and digitally stored.  Video monitoring was available and reviewed as appropriate.  Description: The posterior dominant rhythm consists of 11 Hz activity of moderate voltage (25-35 uV) seen predominantly in posterior head regions, symmetric and reactive to eye opening and eye closing. Sleep was characterized by vertex waves, sleep spindles (12 to 14 Hz), maximal frontocentral region. Hyperventilation did not show any EEG change.  Physiologic photic driving was not seen during photic stimulation.    IMPRESSION: This study is within normal limits. No seizures or epileptiform discharges were seen throughout the recording.  A normal interictal EEG does not exclude nor support the diagnosis of epilepsy.   Danniela Mcbrearty Barbra Sarks

## 2022-02-21 ENCOUNTER — Ambulatory Visit (HOSPITAL_COMMUNITY): Payer: No Typology Code available for payment source

## 2022-07-27 ENCOUNTER — Other Ambulatory Visit: Payer: Self-pay | Admitting: Specialist

## 2022-07-27 DIAGNOSIS — Z025 Encounter for examination for participation in sport: Secondary | ICD-10-CM

## 2022-07-29 ENCOUNTER — Other Ambulatory Visit: Payer: Self-pay

## 2022-07-29 DIAGNOSIS — Z025 Encounter for examination for participation in sport: Secondary | ICD-10-CM

## 2022-08-04 ENCOUNTER — Other Ambulatory Visit: Payer: Self-pay | Admitting: Specialist

## 2022-08-04 DIAGNOSIS — Z025 Encounter for examination for participation in sport: Secondary | ICD-10-CM

## 2022-08-15 ENCOUNTER — Other Ambulatory Visit: Payer: Self-pay

## 2024-07-04 ENCOUNTER — Other Ambulatory Visit: Payer: Self-pay

## 2024-07-04 DIAGNOSIS — Z0000 Encounter for general adult medical examination without abnormal findings: Secondary | ICD-10-CM

## 2024-07-05 ENCOUNTER — Other Ambulatory Visit: Payer: Self-pay

## 2024-07-09 ENCOUNTER — Other Ambulatory Visit: Payer: Self-pay
